# Patient Record
Sex: Female | Born: 1950 | Race: White | Hispanic: No | Marital: Married | Smoking: Never smoker
Health system: Southern US, Community
[De-identification: ages and names within clinical notes are randomized; demographics above are authoritative.]

---

## 2018-10-09 ENCOUNTER — Other Ambulatory Visit: Payer: Self-pay | Admitting: Family Medicine

## 2018-10-09 ENCOUNTER — Encounter: Payer: Self-pay | Admitting: Family Medicine

## 2018-10-09 ENCOUNTER — Other Ambulatory Visit: Payer: Self-pay

## 2018-10-09 ENCOUNTER — Ambulatory Visit: Payer: Medicare HMO | Admitting: Family Medicine

## 2018-10-09 VITALS — BP 132/82 | Ht 65.0 in | Wt 145.0 lb

## 2018-10-09 DIAGNOSIS — S92354A Nondisplaced fracture of fifth metatarsal bone, right foot, initial encounter for closed fracture: Secondary | ICD-10-CM

## 2018-10-09 NOTE — Patient Instructions (Signed)
Nice to meet you Please continue ice  Please try tylenol  Please try vitamin D and Vitamin K2  We will call about the evenity   Please send me a message in MyChart with any questions or updates.  Please see me back in 1 week.   --Dr. Raeford Razor

## 2018-10-09 NOTE — Progress Notes (Signed)
Mia Pugh - 68 y.o. female MRN [id number]  Date of birth: Jun 19, 1950  SUBJECTIVE:  Including CC & ROS.  Chief Complaint  Patient presents with  . Foot Injury    right foot 5th metatarsal    Mia Pugh is a 68 y.o. female that is presenting with right foot pain.  She had an injury to her foot when she was coming down the stairs about 2 weeks ago.  She noticed some ecchymosis and swelling at that time.  She continued to walk on her foot.  She was seen by the urgent care recently and diagnosed with a fracture of the fifth metatarsal.  She is placed in a cam walker.  Her swelling is improved since that time.  Only experiences pain intermittently when she is ambulating.  She is not currently taking anything for bone maintenance and no prior bone scan.  Pain is localized to the lateral forefoot over the fifth metatarsal.  Mild in nature.  Has been taking Aleve for pain.  Review of the notes from [facility] shows the x-rays obtained were revealing for a mid to distal with slight displacement fifth metatarsal fracture.   Review of Systems  Constitutional: Negative for fever.  HENT: Negative for congestion.   Respiratory: Negative for cough.   Cardiovascular: Negative for chest pain.  Gastrointestinal: Negative for abdominal pain.  Musculoskeletal: Positive for arthralgias and gait problem.  Skin: Negative for color change.  Neurological: Negative for weakness.  Hematological: Negative for adenopathy.    HISTORY: Past Medical, Surgical, Social, and Family History Reviewed & Updated per EMR.   Pertinent Historical Findings include:  History reviewed. No pertinent past medical history.  History reviewed. No pertinent surgical history.  No Known Allergies  History reviewed. No pertinent family history.   Social History   Socioeconomic History  . Marital status: Married    Spouse name: Not on file  . Number of children: Not on file  . Years of education: Not on  file  . Highest education level: Not on file  Occupational History  . Not on file  Social Needs  . Financial resource strain: Not on file  . Food insecurity    Worry: Not on file    Inability: Not on file  . Transportation needs    Medical: Not on file    Non-medical: Not on file  Tobacco Use  . Smoking status: Never Smoker  . Smokeless tobacco: Never Used  Substance and Sexual Activity  . Alcohol use: Not on file  . Drug use: Not on file  . Sexual activity: Not on file  Lifestyle  . Physical activity    Days per week: Not on file    Minutes per session: Not on file  . Stress: Not on file  Relationships  . Social Herbalist on phone: Not on file    Gets together: Not on file    Attends religious service: Not on file    Active member of club or organization: Not on file    Attends meetings of clubs or organizations: Not on file    Relationship status: Not on file  . Intimate partner violence    Fear of current or ex partner: Not on file    Emotionally abused: Not on file    Physically abused: Not on file    Forced sexual activity: Not on file  Other Topics Concern  . Not on file  Social History Narrative  . Not  on file     PHYSICAL EXAM:  VS: BP 132/82   Ht 5\' 5"  (1.651 m)   Wt 145 lb (65.8 kg)   BMI 24.13 kg/m  Physical Exam Gen: NAD, alert, cooperative with exam, well-appearing ENT: normal lips, normal nasal mucosa,  Eye: normal EOM, normal conjunctiva and lids CV:  no edema, +2 pedal pulses   Resp: no accessory muscle use, non-labored,  Skin: no rashes, no areas of induration  Neuro: normal tone, normal sensation to touch Psych:  normal insight, alert and oriented MSK:  Right foot:  Tenderness to palpation over the shaft of the fifth metatarsal. No significant ecchymosis or swelling. Normal ankle ROM  No TTP of the base of the 5th MT  Pain upon standing.  NVI      ASSESSMENT & PLAN:   Nondisplaced fracture of fifth metatarsal bone,  right foot, initial encounter for closed fracture Most likely a fragility fracture with the low impact involved with the injury. No prior bone scan.  - change to post op shoe   - counseled on supportive care - counseled on vitamin D and Vitamin k2.  - will try to set up for evenity  - f/u in one week and re-image.

## 2018-10-10 ENCOUNTER — Encounter: Payer: Self-pay | Admitting: Family Medicine

## 2018-10-10 LAB — BASIC METABOLIC PANEL
BUN/Creatinine Ratio: 22 (ref 10–24)
BUN: 18 mg/dL (ref 8–27)
CO2: 27 mmol/L (ref 20–29)
Calcium: 9.5 mg/dL (ref 8.6–10.2)
Chloride: 105 mmol/L (ref 96–106)
Creatinine, Ser: 0.82 mg/dL (ref 0.76–1.27)
GFR calc Af Amer: 107 mL/min/{1.73_m2} (ref >59)
GFR calc non Af Amer: 92 mL/min/{1.73_m2} (ref >59)
Glucose: 85 mg/dL (ref 65–99)
Potassium: 4.7 mmol/L (ref 3.5–5.2)
Sodium: 146 mmol/L — ABNORMAL HIGH (ref 134–144)

## 2018-10-10 NOTE — Assessment & Plan Note (Signed)
Most likely a fragility fracture with the low impact involved with the injury. No prior bone scan.  - change to post op shoe   - counseled on supportive care - counseled on vitamin D and Vitamin k2.  - will try to set up for evenity  - f/u in one week and re-image.

## 2018-10-14 ENCOUNTER — Telehealth: Payer: Self-pay | Admitting: Family Medicine

## 2018-10-14 NOTE — Telephone Encounter (Signed)
Left VM for patient. If she calls back please have her speak with a nurse/CMA and inform that her lab results are normal with just a mildly elevated sodium.   If any questions then please take the best time and phone number to call and I will try to call her back.   Rosemarie Ax, MD [facility] 10/14/2018, 1:44 PM

## 2018-10-16 ENCOUNTER — Ambulatory Visit (HOSPITAL_BASED_OUTPATIENT_CLINIC_OR_DEPARTMENT_OTHER)
Admission: RE | Admit: 2018-10-16 | Discharge: 2018-10-16 | Disposition: A | Discharge status: Home / Self Care | Payer: Medicare HMO | Source: Ambulatory Visit | Attending: Family Medicine | Admitting: Family Medicine

## 2018-10-16 ENCOUNTER — Ambulatory Visit: Payer: Medicare HMO | Admitting: Family Medicine

## 2018-10-16 ENCOUNTER — Other Ambulatory Visit: Payer: Self-pay

## 2018-10-16 ENCOUNTER — Encounter: Payer: Self-pay | Admitting: Family Medicine

## 2018-10-16 VITALS — BP 110/80 | Ht 65.0 in | Wt 145.0 lb

## 2018-10-16 DIAGNOSIS — S92354D Nondisplaced fracture of fifth metatarsal bone, right foot, subsequent encounter for fracture with routine healing: Secondary | ICD-10-CM | POA: Diagnosis not present

## 2018-10-16 DIAGNOSIS — S92354A Nondisplaced fracture of fifth metatarsal bone, right foot, initial encounter for closed fracture: Secondary | ICD-10-CM | POA: Diagnosis not present

## 2018-10-16 NOTE — Patient Instructions (Signed)
Good to see you Please continue ice and elevation  Please continue the vitamin  We will let you know about the evenity  I will call with the results from today   Please send me a message in MyChart with any questions or updates.  Please see me back in 4 weeks.   --Dr. Raeford Razor

## 2018-10-16 NOTE — Progress Notes (Signed)
Mia OctaveLinda Pugh - 68 y.o. female MRN [id number]  Date of birth: [birth year]  SUBJECTIVE:  Including CC & ROS.  Chief Complaint  Patient presents with  . Follow-up    follow up for right foot    Mia Pugh is a 68 y.o. female that is following up for her fifth metatarsal fracture.  Pain is about the same.  She has been in a cam walker since last being seen.  Pain is occurring over the plantar aspect of the lateral forefoot.  Has some swelling over this area.  Has taken medications as needed.  She is taking the vitamins.  The bone scan is not scheduled until November.   Review of Systems  Constitutional: Negative for fever.  HENT: Negative for congestion.   Cardiovascular: Negative for chest pain.  Gastrointestinal: Negative for abdominal pain.  Musculoskeletal: Positive for gait problem.  Skin: Negative for color change.  Neurological: Negative for weakness.  Hematological: Negative for adenopathy.    HISTORY: Past Medical, Surgical, Social, and Family History Reviewed & Updated per EMR.   Pertinent Historical Findings include:  No past medical history on file.  No past surgical history on file.  No Known Allergies  No family history on file.   Social History   Socioeconomic History  . Marital status: Married    Spouse name: Not on file  . Number of children: Not on file  . Years of education: Not on file  . Highest education level: Not on file  Occupational History  . Not on file  Social Needs  . Financial resource strain: Not on file  . Food insecurity    Worry: Not on file    Inability: Not on file  . Transportation needs    Medical: Not on file    Non-medical: Not on file  Tobacco Use  . Smoking status: Never Smoker  . Smokeless tobacco: Never Used  Substance and Sexual Activity  . Alcohol use: Not on file  . Drug use: Not on file  . Sexual activity: Not on file  Lifestyle  . Physical activity    Days per week: Not on file    Minutes per session: Not  on file  . Stress: Not on file  Relationships  . Social Musicianconnections    Talks on phone: Not on file    Gets together: Not on file    Attends religious service: Not on file    Active member of club or organization: Not on file    Attends meetings of clubs or organizations: Not on file    Relationship status: Not on file  . Intimate partner violence    Fear of current or ex partner: Not on file    Emotionally abused: Not on file    Physically abused: Not on file    Forced sexual activity: Not on file  Other Topics Concern  . Not on file  Social History Narrative  . Not on file     PHYSICAL EXAM:  VS: BP 110/80   Ht 5\' 5"  (1.651 m)   Wt 145 lb (65.8 kg)   BMI 24.13 kg/m  Physical Exam Gen: NAD, alert, cooperative with exam, well-appearing ENT: normal lips, normal nasal mucosa,  Eye: normal EOM, normal conjunctiva and lids CV:  no edema, +2 pedal pulses   Resp: no accessory muscle use, non-labored,  Skin: no rashes, no areas of induration  Neuro: normal tone, normal sensation to touch Psych:  normal insight, alert and oriented MSK:  Right foot: Tenderness to palpation of the fifth metatarsal shaft. Swelling of this area. Significant hallux valgus. Several hammertoes. Neurovascular intact     ASSESSMENT & PLAN:   Nondisplaced fracture of fifth metatarsal bone, right foot, initial encounter for closed fracture Pain about the same.  Is taking vitamin K 2. -Has significant hammertoes, hallux valgus, and pes cavus.  Placed a scaphoid pad and metatarsal pad may need to try additional padding within the cam walker. -X-ray. -Follow-up in 4 weeks.

## 2018-10-16 NOTE — Assessment & Plan Note (Signed)
Pain about the same.  Is taking vitamin K 2. -Has significant hammertoes, hallux valgus, and pes cavus.  Placed a scaphoid pad and metatarsal pad may need to try additional padding within the cam walker. -X-ray. -Follow-up in 4 weeks.

## 2018-10-17 ENCOUNTER — Telehealth: Payer: Self-pay | Admitting: Family Medicine

## 2018-10-17 NOTE — Telephone Encounter (Signed)
Informed of results.   Rosemarie Ax, MD [facility] 10/17/2018, 11:52 AM

## 2018-11-07 ENCOUNTER — Ambulatory Visit: Payer: Medicare HMO | Admitting: Family Medicine

## 2018-11-07 ENCOUNTER — Other Ambulatory Visit: Payer: Self-pay

## 2018-11-07 ENCOUNTER — Encounter: Payer: Self-pay | Admitting: Family Medicine

## 2018-11-07 ENCOUNTER — Ambulatory Visit (HOSPITAL_BASED_OUTPATIENT_CLINIC_OR_DEPARTMENT_OTHER)
Admission: RE | Admit: 2018-11-07 | Discharge: 2018-11-07 | Disposition: A | Discharge status: Home / Self Care | Payer: Medicare HMO | Source: Ambulatory Visit | Attending: Family Medicine | Admitting: Family Medicine

## 2018-11-07 ENCOUNTER — Other Ambulatory Visit: Payer: Self-pay | Admitting: Family Medicine

## 2018-11-07 VITALS — Ht 65.0 in | Wt 145.0 lb

## 2018-11-07 DIAGNOSIS — S92354D Nondisplaced fracture of fifth metatarsal bone, right foot, subsequent encounter for fracture with routine healing: Secondary | ICD-10-CM

## 2018-11-07 DIAGNOSIS — S92354A Nondisplaced fracture of fifth metatarsal bone, right foot, initial encounter for closed fracture: Secondary | ICD-10-CM | POA: Diagnosis not present

## 2018-11-07 MED ORDER — DENOSUMAB 60 MG/ML ~~LOC~~ SOSY: 60.0000 mg | PREFILLED_SYRINGE | SUBCUTANEOUS | 0 refills | Status: AC

## 2018-11-07 NOTE — Assessment & Plan Note (Signed)
Initial injury on 8/23.  Has been using a cam walker and had improvement of her symptoms. -X-ray today. -Has bone scan next month and can follow-up bone health with her primary. - counseled on supportive care

## 2018-11-07 NOTE — Progress Notes (Signed)
Mia Pugh - 68 y.o. female MRN [id number]  Date of birth: 1950-07-04  SUBJECTIVE:  Including CC & ROS.  Chief Complaint  Patient presents with  . Follow-up    follow up for right foot    Mia Pugh is a 68 y.o. female that is following up for her fifth metatarsal shaft fracture.  Her initial injury was on 8/23.  Has been using a cam walker and had improvement of her pain.  She denies any significant swelling.  She still experiences pain if she walks without the cam walker.  The pain is occurring over the shaft of the fifth metatarsal.  It is intermittent in nature and mild.  Denies any pain when she has the cam walker on.   Review of Systems  Constitutional: Negative for fever.  HENT: Negative for congestion.   Respiratory: Negative for cough.   Cardiovascular: Negative for chest pain.  Gastrointestinal: Negative for abdominal pain.  Musculoskeletal: Positive for gait problem.  Skin: Negative for color change.  Neurological: Negative for weakness.  Hematological: Negative for adenopathy.    HISTORY: Past Medical, Surgical, Social, and Family History Reviewed & Updated per EMR.   Pertinent Historical Findings include:  No past medical history on file.  No past surgical history on file.  No Known Allergies  No family history on file.   Social History   Socioeconomic History  . Marital status: Married    Spouse name: Not on file  . Number of children: Not on file  . Years of education: Not on file  . Highest education level: Not on file  Occupational History  . Not on file  Social Needs  . Financial resource strain: Not on file  . Food insecurity    Worry: Not on file    Inability: Not on file  . Transportation needs    Medical: Not on file    Non-medical: Not on file  Tobacco Use  . Smoking status: Never Smoker  . Smokeless tobacco: Never Used  Substance and Sexual Activity  . Alcohol use: Not on file  . Drug use: Not on file  . Sexual activity: Not on  file  Lifestyle  . Physical activity    Days per week: Not on file    Minutes per session: Not on file  . Stress: Not on file  Relationships  . Social Musician on phone: Not on file    Gets together: Not on file    Attends religious service: Not on file    Active member of club or organization: Not on file    Attends meetings of clubs or organizations: Not on file    Relationship status: Not on file  . Intimate partner violence    Fear of current or ex partner: Not on file    Emotionally abused: Not on file    Physically abused: Not on file    Forced sexual activity: Not on file  Other Topics Concern  . Not on file  Social History Narrative  . Not on file     PHYSICAL EXAM:  VS: Ht 5\' 5"  (1.651 m)   Wt 145 lb (65.8 kg)   BMI 24.13 kg/m  Physical Exam Gen: NAD, alert, cooperative with exam, well-appearing ENT: normal lips, normal nasal mucosa,  Eye: normal EOM, normal conjunctiva and lids CV:  no edema, +2 pedal pulses   Resp: no accessory muscle use, non-labored,  Skin: no rashes, no areas of induration  Neuro: normal  tone, normal sensation to touch Psych:  normal insight, alert and oriented MSK:  Right foot:  No significant swelling or ecchymosis. Mild tenderness to palpation over the dorsal shaft of the fifth metatarsal. No tenderness to palpation at the base of the fifth. Normal range of motion of the ankle. Neurovascular intact     ASSESSMENT & PLAN:   Nondisplaced fracture of fifth metatarsal bone, right foot, initial encounter for closed fracture Initial injury on 8/23.  Has been using a cam walker and had improvement of her symptoms. -X-ray today. -Has bone scan next month and can follow-up bone health with her primary. - counseled on supportive care

## 2018-11-08 ENCOUNTER — Telehealth: Payer: Self-pay | Admitting: Family Medicine

## 2018-11-08 NOTE — Telephone Encounter (Signed)
Informed patient of results.   Rosemarie Ax, MD [facility] 11/08/2018, 10:02 AM

## 2020-03-17 IMAGING — DX DG FOOT COMPLETE 3+V*R*
3 series · 3 of 3 positions shown · non-contrast
Comparison: 10/16/2018

CLINICAL DATA: Fifth metatarsal fracture.

EXAM:
RIGHT FOOT COMPLETE - 3+ VIEW

[foot ap]
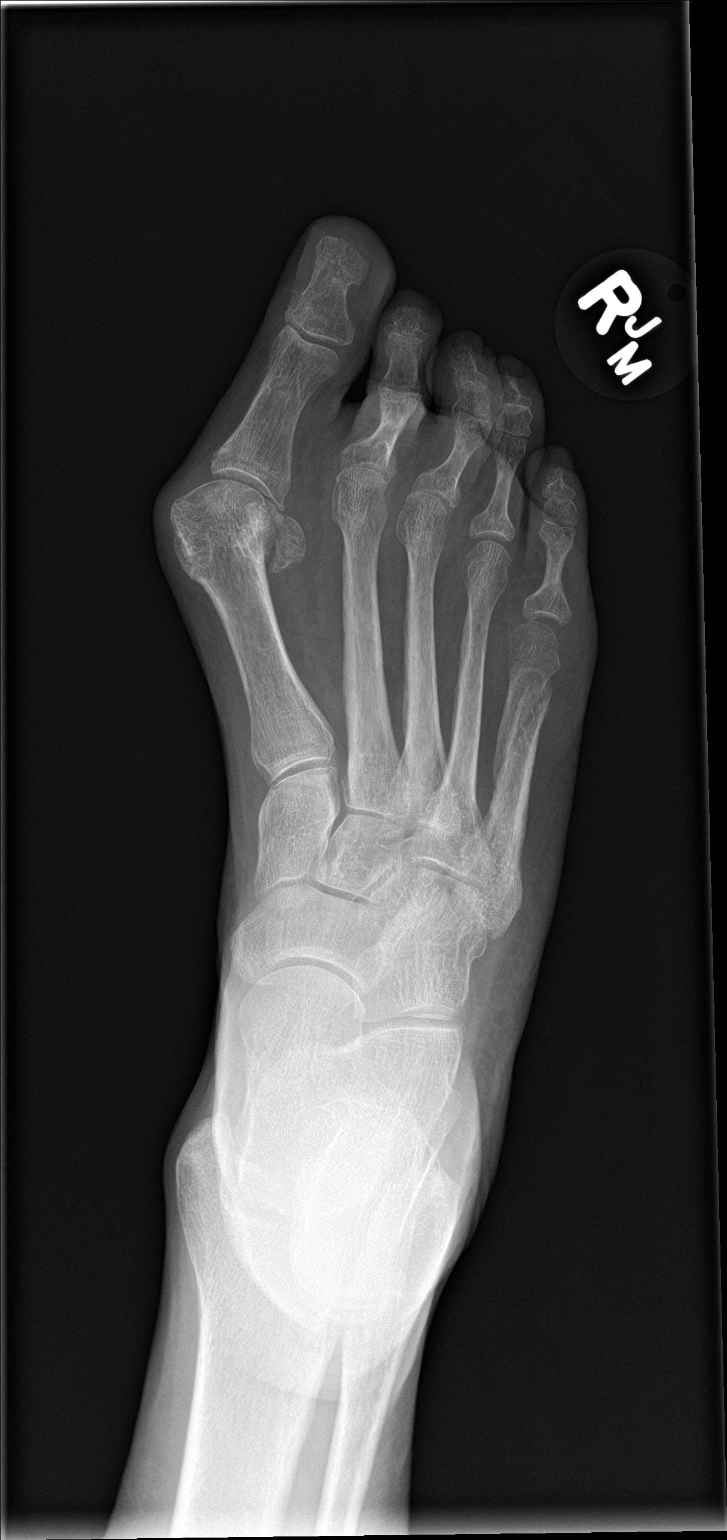

[foot obl]
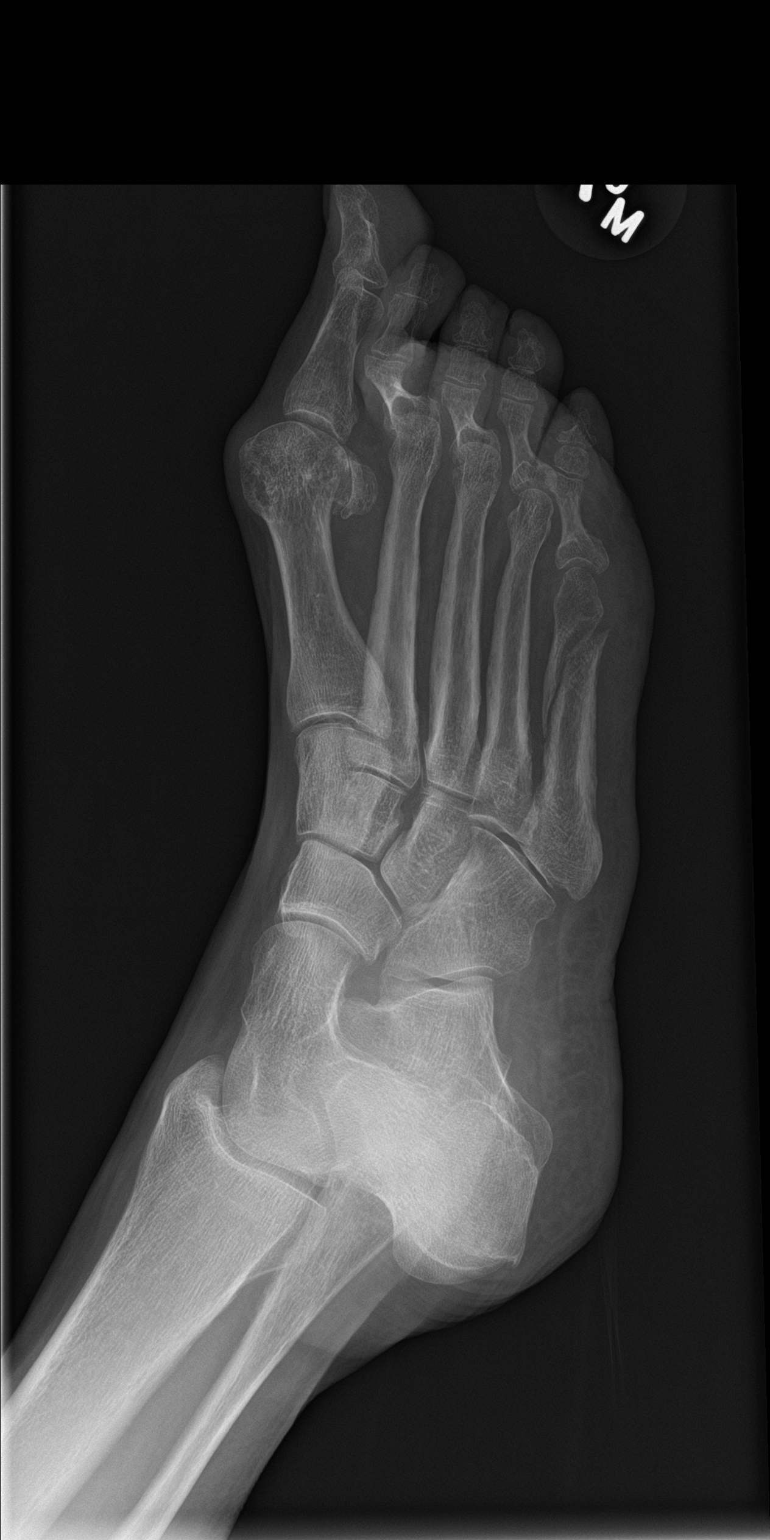

[foot lat]
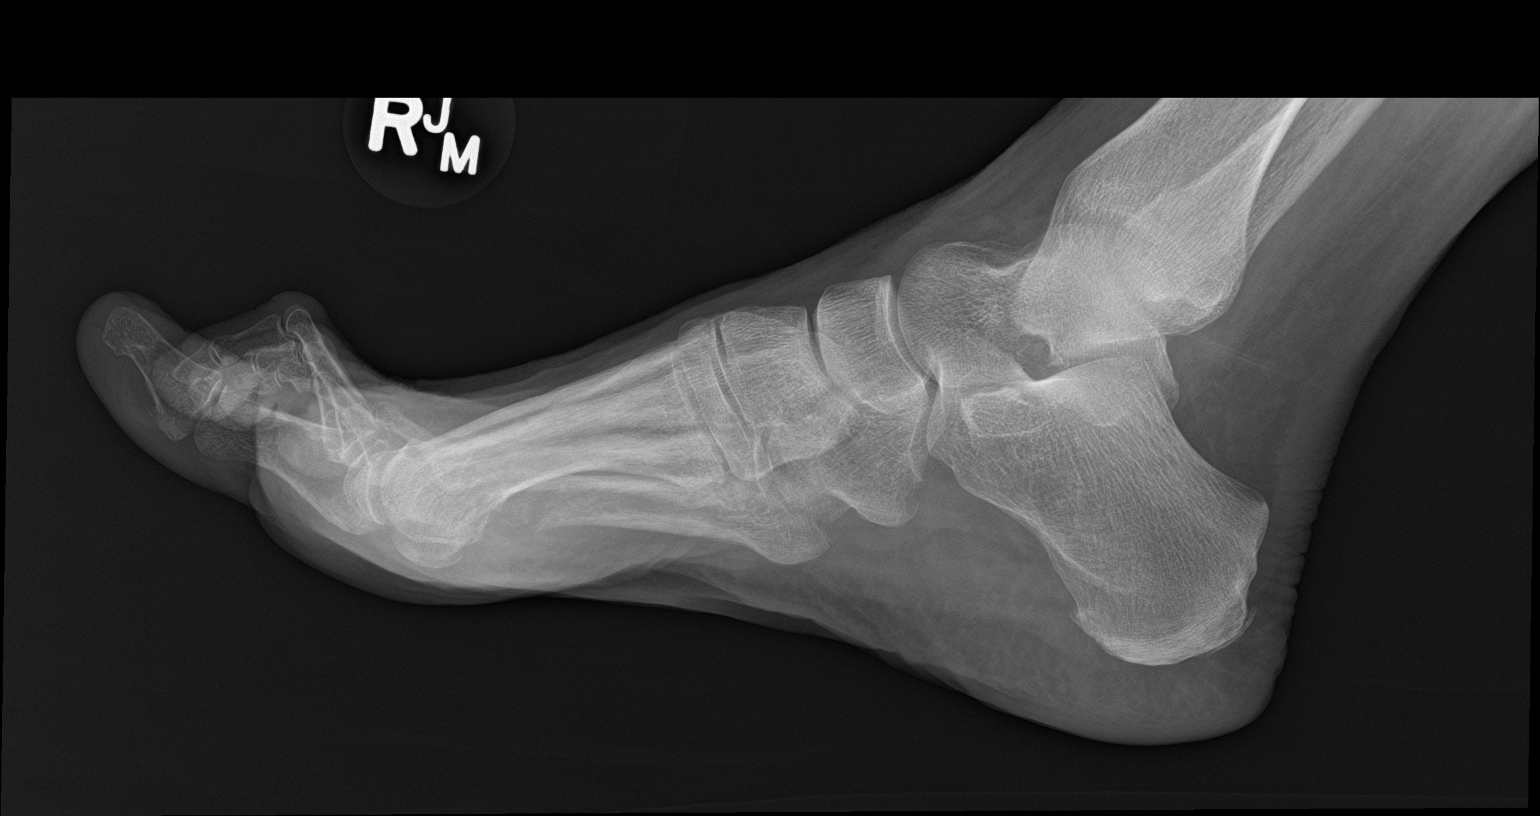

[3 of 3 positions shown; findings below may reference images not displayed]

FINDINGS: Hallux valgus deformity. Degenerative change of the first MTP joint.
Evidence of patient's known oblique minimally displaced fracture of
the mid to distal shaft of the fifth metatarsal without significant
change. No new fractures identified. Remainder the exam is
unchanged.
IMPRESSION: Stable displaced oblique fracture of the mid to distal shaft of the
fifth metatarsal.

Hallux valgus deformity with degenerative change of the first MTP
joint.

## 2022-05-15 ENCOUNTER — Encounter: Payer: Self-pay | Admitting: *Deleted
